# Patient Record
Sex: Female | Born: 1950 | Race: White | Hispanic: No | Marital: Married | Smoking: Former smoker
Health system: Southern US, Community
[De-identification: ages and names within clinical notes are randomized; demographics above are authoritative.]

---

## 2013-08-12 DIAGNOSIS — M545 Low back pain, unspecified: Secondary | ICD-10-CM | POA: Diagnosis not present

## 2013-08-12 DIAGNOSIS — G479 Sleep disorder, unspecified: Secondary | ICD-10-CM | POA: Diagnosis not present

## 2013-08-12 DIAGNOSIS — F411 Generalized anxiety disorder: Secondary | ICD-10-CM | POA: Diagnosis not present

## 2013-08-15 DIAGNOSIS — M545 Low back pain, unspecified: Secondary | ICD-10-CM | POA: Diagnosis not present

## 2013-08-24 DIAGNOSIS — M533 Sacrococcygeal disorders, not elsewhere classified: Secondary | ICD-10-CM | POA: Diagnosis not present

## 2013-08-27 DIAGNOSIS — M533 Sacrococcygeal disorders, not elsewhere classified: Secondary | ICD-10-CM | POA: Diagnosis not present

## 2013-09-01 DIAGNOSIS — M545 Low back pain, unspecified: Secondary | ICD-10-CM | POA: Diagnosis not present

## 2013-09-09 ENCOUNTER — Other Ambulatory Visit: Payer: Self-pay | Admitting: Family Medicine

## 2013-09-09 DIAGNOSIS — F411 Generalized anxiety disorder: Secondary | ICD-10-CM | POA: Diagnosis not present

## 2013-09-09 DIAGNOSIS — Z1239 Encounter for other screening for malignant neoplasm of breast: Secondary | ICD-10-CM | POA: Diagnosis not present

## 2013-09-09 DIAGNOSIS — M810 Age-related osteoporosis without current pathological fracture: Secondary | ICD-10-CM | POA: Diagnosis not present

## 2013-09-09 DIAGNOSIS — Z1211 Encounter for screening for malignant neoplasm of colon: Secondary | ICD-10-CM | POA: Diagnosis not present

## 2013-09-09 DIAGNOSIS — Z23 Encounter for immunization: Secondary | ICD-10-CM | POA: Diagnosis not present

## 2013-09-09 DIAGNOSIS — M948X9 Other specified disorders of cartilage, unspecified sites: Secondary | ICD-10-CM | POA: Diagnosis not present

## 2013-09-09 DIAGNOSIS — G479 Sleep disorder, unspecified: Secondary | ICD-10-CM | POA: Diagnosis not present

## 2013-09-09 DIAGNOSIS — K120 Recurrent oral aphthae: Secondary | ICD-10-CM | POA: Diagnosis not present

## 2013-09-09 DIAGNOSIS — Z0000 Encounter for general adult medical examination without abnormal findings: Secondary | ICD-10-CM | POA: Diagnosis not present

## 2013-09-09 DIAGNOSIS — Z136 Encounter for screening for cardiovascular disorders: Secondary | ICD-10-CM | POA: Diagnosis not present

## 2013-09-09 DIAGNOSIS — Z1231 Encounter for screening mammogram for malignant neoplasm of breast: Secondary | ICD-10-CM

## 2013-09-14 DIAGNOSIS — M545 Low back pain, unspecified: Secondary | ICD-10-CM | POA: Diagnosis not present

## 2013-09-15 DIAGNOSIS — M899 Disorder of bone, unspecified: Secondary | ICD-10-CM | POA: Diagnosis not present

## 2013-09-15 DIAGNOSIS — Z136 Encounter for screening for cardiovascular disorders: Secondary | ICD-10-CM | POA: Diagnosis not present

## 2013-09-15 DIAGNOSIS — M949 Disorder of cartilage, unspecified: Secondary | ICD-10-CM | POA: Diagnosis not present

## 2013-09-15 DIAGNOSIS — Z0000 Encounter for general adult medical examination without abnormal findings: Secondary | ICD-10-CM | POA: Diagnosis not present

## 2013-09-15 DIAGNOSIS — M948X9 Other specified disorders of cartilage, unspecified sites: Secondary | ICD-10-CM | POA: Diagnosis not present

## 2013-09-23 DIAGNOSIS — M545 Low back pain, unspecified: Secondary | ICD-10-CM | POA: Diagnosis not present

## 2013-09-28 DIAGNOSIS — M533 Sacrococcygeal disorders, not elsewhere classified: Secondary | ICD-10-CM | POA: Diagnosis not present

## 2013-09-30 DIAGNOSIS — M545 Low back pain, unspecified: Secondary | ICD-10-CM | POA: Diagnosis not present

## 2013-10-07 DIAGNOSIS — M545 Low back pain, unspecified: Secondary | ICD-10-CM | POA: Diagnosis not present

## 2013-10-18 DIAGNOSIS — M47817 Spondylosis without myelopathy or radiculopathy, lumbosacral region: Secondary | ICD-10-CM | POA: Diagnosis not present

## 2013-10-21 DIAGNOSIS — L570 Actinic keratosis: Secondary | ICD-10-CM | POA: Diagnosis not present

## 2013-10-21 DIAGNOSIS — Z808 Family history of malignant neoplasm of other organs or systems: Secondary | ICD-10-CM | POA: Diagnosis not present

## 2013-10-21 DIAGNOSIS — D225 Melanocytic nevi of trunk: Secondary | ICD-10-CM | POA: Diagnosis not present

## 2013-10-21 DIAGNOSIS — D1801 Hemangioma of skin and subcutaneous tissue: Secondary | ICD-10-CM | POA: Diagnosis not present

## 2013-10-21 DIAGNOSIS — L821 Other seborrheic keratosis: Secondary | ICD-10-CM | POA: Diagnosis not present

## 2013-10-21 DIAGNOSIS — D239 Other benign neoplasm of skin, unspecified: Secondary | ICD-10-CM | POA: Diagnosis not present

## 2013-10-21 DIAGNOSIS — L814 Other melanin hyperpigmentation: Secondary | ICD-10-CM | POA: Diagnosis not present

## 2013-10-21 DIAGNOSIS — L718 Other rosacea: Secondary | ICD-10-CM | POA: Diagnosis not present

## 2013-11-02 ENCOUNTER — Ambulatory Visit
Admission: RE | Admit: 2013-11-02 | Discharge: 2013-11-02 | Disposition: A | Discharge status: Home / Self Care | Payer: Medicare Other | Source: Ambulatory Visit | Attending: Family Medicine | Admitting: Family Medicine

## 2013-11-02 DIAGNOSIS — Z1231 Encounter for screening mammogram for malignant neoplasm of breast: Secondary | ICD-10-CM | POA: Diagnosis not present

## 2013-11-02 DIAGNOSIS — L718 Other rosacea: Secondary | ICD-10-CM | POA: Diagnosis not present

## 2013-11-02 DIAGNOSIS — L814 Other melanin hyperpigmentation: Secondary | ICD-10-CM | POA: Diagnosis not present

## 2013-11-02 DIAGNOSIS — L570 Actinic keratosis: Secondary | ICD-10-CM | POA: Diagnosis not present

## 2013-11-09 DIAGNOSIS — M47816 Spondylosis without myelopathy or radiculopathy, lumbar region: Secondary | ICD-10-CM | POA: Diagnosis not present

## 2013-11-12 DIAGNOSIS — G4700 Insomnia, unspecified: Secondary | ICD-10-CM | POA: Diagnosis not present

## 2013-11-12 DIAGNOSIS — R197 Diarrhea, unspecified: Secondary | ICD-10-CM | POA: Diagnosis not present

## 2013-11-12 DIAGNOSIS — F411 Generalized anxiety disorder: Secondary | ICD-10-CM | POA: Diagnosis not present

## 2013-11-29 DIAGNOSIS — M545 Low back pain: Secondary | ICD-10-CM | POA: Diagnosis not present

## 2013-12-01 ENCOUNTER — Other Ambulatory Visit: Payer: Self-pay | Admitting: Physical Medicine and Rehabilitation

## 2013-12-01 DIAGNOSIS — M533 Sacrococcygeal disorders, not elsewhere classified: Principal | ICD-10-CM

## 2013-12-01 DIAGNOSIS — G8929 Other chronic pain: Secondary | ICD-10-CM

## 2013-12-08 ENCOUNTER — Ambulatory Visit
Admission: RE | Admit: 2013-12-08 | Discharge: 2013-12-08 | Disposition: A | Discharge status: Home / Self Care | Payer: Medicare Other | Source: Ambulatory Visit | Attending: Physical Medicine and Rehabilitation | Admitting: Physical Medicine and Rehabilitation

## 2013-12-08 DIAGNOSIS — M533 Sacrococcygeal disorders, not elsewhere classified: Secondary | ICD-10-CM | POA: Diagnosis not present

## 2013-12-08 DIAGNOSIS — G8929 Other chronic pain: Secondary | ICD-10-CM

## 2013-12-08 MED ORDER — METHYLPREDNISOLONE ACETATE 40 MG/ML INJ SUSP (RADIOLOG: 120.0000 mg | Freq: Once | INTRAMUSCULAR | Status: DC

## 2013-12-09 DIAGNOSIS — F411 Generalized anxiety disorder: Secondary | ICD-10-CM | POA: Diagnosis not present

## 2013-12-09 DIAGNOSIS — R197 Diarrhea, unspecified: Secondary | ICD-10-CM | POA: Diagnosis not present

## 2013-12-09 DIAGNOSIS — G4700 Insomnia, unspecified: Secondary | ICD-10-CM | POA: Diagnosis not present

## 2014-02-14 DIAGNOSIS — K648 Other hemorrhoids: Secondary | ICD-10-CM | POA: Diagnosis not present

## 2014-02-14 DIAGNOSIS — K644 Residual hemorrhoidal skin tags: Secondary | ICD-10-CM | POA: Diagnosis not present

## 2014-02-14 DIAGNOSIS — Z1211 Encounter for screening for malignant neoplasm of colon: Secondary | ICD-10-CM | POA: Diagnosis not present

## 2014-04-21 DIAGNOSIS — G4700 Insomnia, unspecified: Secondary | ICD-10-CM | POA: Diagnosis not present

## 2014-04-21 DIAGNOSIS — F418 Other specified anxiety disorders: Secondary | ICD-10-CM | POA: Diagnosis not present

## 2014-04-22 DIAGNOSIS — M545 Low back pain: Secondary | ICD-10-CM | POA: Diagnosis not present

## 2014-04-29 DIAGNOSIS — M545 Low back pain: Secondary | ICD-10-CM | POA: Diagnosis not present

## 2014-05-04 DIAGNOSIS — M5136 Other intervertebral disc degeneration, lumbar region: Secondary | ICD-10-CM | POA: Diagnosis not present

## 2014-05-19 DIAGNOSIS — L659 Nonscarring hair loss, unspecified: Secondary | ICD-10-CM | POA: Diagnosis not present

## 2014-05-19 DIAGNOSIS — G4700 Insomnia, unspecified: Secondary | ICD-10-CM | POA: Diagnosis not present

## 2014-05-26 DIAGNOSIS — M5136 Other intervertebral disc degeneration, lumbar region: Secondary | ICD-10-CM | POA: Diagnosis not present

## 2014-06-06 DIAGNOSIS — M5136 Other intervertebral disc degeneration, lumbar region: Secondary | ICD-10-CM | POA: Diagnosis not present

## 2014-07-07 DIAGNOSIS — L650 Telogen effluvium: Secondary | ICD-10-CM | POA: Diagnosis not present

## 2014-07-18 DIAGNOSIS — G8929 Other chronic pain: Secondary | ICD-10-CM | POA: Diagnosis not present

## 2014-07-18 DIAGNOSIS — M545 Low back pain: Secondary | ICD-10-CM | POA: Diagnosis not present

## 2014-07-18 DIAGNOSIS — R030 Elevated blood-pressure reading, without diagnosis of hypertension: Secondary | ICD-10-CM | POA: Diagnosis not present

## 2014-07-18 DIAGNOSIS — M4696 Unspecified inflammatory spondylopathy, lumbar region: Secondary | ICD-10-CM | POA: Diagnosis not present

## 2014-08-29 DIAGNOSIS — M47816 Spondylosis without myelopathy or radiculopathy, lumbar region: Secondary | ICD-10-CM | POA: Diagnosis not present

## 2014-08-29 DIAGNOSIS — M545 Low back pain: Secondary | ICD-10-CM | POA: Diagnosis not present

## 2014-08-29 DIAGNOSIS — M4696 Unspecified inflammatory spondylopathy, lumbar region: Secondary | ICD-10-CM | POA: Diagnosis not present

## 2014-10-03 DIAGNOSIS — M47816 Spondylosis without myelopathy or radiculopathy, lumbar region: Secondary | ICD-10-CM | POA: Diagnosis not present

## 2014-10-03 DIAGNOSIS — M545 Low back pain: Secondary | ICD-10-CM | POA: Diagnosis not present

## 2014-10-17 DIAGNOSIS — M4696 Unspecified inflammatory spondylopathy, lumbar region: Secondary | ICD-10-CM | POA: Diagnosis not present

## 2014-10-17 DIAGNOSIS — G8929 Other chronic pain: Secondary | ICD-10-CM | POA: Diagnosis not present

## 2014-10-17 DIAGNOSIS — M545 Low back pain: Secondary | ICD-10-CM | POA: Diagnosis not present

## 2014-10-17 DIAGNOSIS — M47816 Spondylosis without myelopathy or radiculopathy, lumbar region: Secondary | ICD-10-CM | POA: Diagnosis not present

## 2014-10-25 DIAGNOSIS — L570 Actinic keratosis: Secondary | ICD-10-CM | POA: Diagnosis not present

## 2014-10-25 DIAGNOSIS — L814 Other melanin hyperpigmentation: Secondary | ICD-10-CM | POA: Diagnosis not present

## 2014-10-25 DIAGNOSIS — D1800 Hemangioma unspecified site: Secondary | ICD-10-CM | POA: Diagnosis not present

## 2014-10-25 DIAGNOSIS — D225 Melanocytic nevi of trunk: Secondary | ICD-10-CM | POA: Diagnosis not present

## 2014-10-25 DIAGNOSIS — L821 Other seborrheic keratosis: Secondary | ICD-10-CM | POA: Diagnosis not present

## 2014-11-06 DIAGNOSIS — W19XXXA Unspecified fall, initial encounter: Secondary | ICD-10-CM | POA: Diagnosis not present

## 2014-11-06 DIAGNOSIS — R0789 Other chest pain: Secondary | ICD-10-CM | POA: Diagnosis not present

## 2014-11-06 DIAGNOSIS — W1800XA Striking against unspecified object with subsequent fall, initial encounter: Secondary | ICD-10-CM | POA: Diagnosis not present

## 2014-11-07 DIAGNOSIS — M47816 Spondylosis without myelopathy or radiculopathy, lumbar region: Secondary | ICD-10-CM | POA: Diagnosis not present

## 2014-11-07 DIAGNOSIS — M545 Low back pain: Secondary | ICD-10-CM | POA: Diagnosis not present

## 2015-01-09 DIAGNOSIS — M47816 Spondylosis without myelopathy or radiculopathy, lumbar region: Secondary | ICD-10-CM | POA: Diagnosis not present

## 2015-01-09 DIAGNOSIS — M4696 Unspecified inflammatory spondylopathy, lumbar region: Secondary | ICD-10-CM | POA: Diagnosis not present

## 2015-01-09 DIAGNOSIS — M545 Low back pain: Secondary | ICD-10-CM | POA: Diagnosis not present

## 2015-01-09 DIAGNOSIS — G8929 Other chronic pain: Secondary | ICD-10-CM | POA: Diagnosis not present

## 2015-01-30 DIAGNOSIS — M545 Low back pain: Secondary | ICD-10-CM | POA: Diagnosis not present

## 2015-01-30 DIAGNOSIS — M6281 Muscle weakness (generalized): Secondary | ICD-10-CM | POA: Diagnosis not present

## 2015-02-02 DIAGNOSIS — M6281 Muscle weakness (generalized): Secondary | ICD-10-CM | POA: Diagnosis not present

## 2015-02-02 DIAGNOSIS — M545 Low back pain: Secondary | ICD-10-CM | POA: Diagnosis not present

## 2015-02-06 DIAGNOSIS — M545 Low back pain: Secondary | ICD-10-CM | POA: Diagnosis not present

## 2015-02-06 DIAGNOSIS — M6281 Muscle weakness (generalized): Secondary | ICD-10-CM | POA: Diagnosis not present

## 2015-02-09 DIAGNOSIS — M545 Low back pain: Secondary | ICD-10-CM | POA: Diagnosis not present

## 2015-02-09 DIAGNOSIS — M6281 Muscle weakness (generalized): Secondary | ICD-10-CM | POA: Diagnosis not present

## 2015-02-12 DIAGNOSIS — S90122A Contusion of left lesser toe(s) without damage to nail, initial encounter: Secondary | ICD-10-CM | POA: Diagnosis not present

## 2015-02-13 DIAGNOSIS — M6281 Muscle weakness (generalized): Secondary | ICD-10-CM | POA: Diagnosis not present

## 2015-02-13 DIAGNOSIS — M545 Low back pain: Secondary | ICD-10-CM | POA: Diagnosis not present

## 2015-02-16 DIAGNOSIS — M6281 Muscle weakness (generalized): Secondary | ICD-10-CM | POA: Diagnosis not present

## 2015-02-16 DIAGNOSIS — M545 Low back pain: Secondary | ICD-10-CM | POA: Diagnosis not present

## 2015-02-20 DIAGNOSIS — M545 Low back pain: Secondary | ICD-10-CM | POA: Diagnosis not present

## 2015-02-20 DIAGNOSIS — M6281 Muscle weakness (generalized): Secondary | ICD-10-CM | POA: Diagnosis not present

## 2015-02-23 DIAGNOSIS — M545 Low back pain: Secondary | ICD-10-CM | POA: Diagnosis not present

## 2015-02-23 DIAGNOSIS — M6281 Muscle weakness (generalized): Secondary | ICD-10-CM | POA: Diagnosis not present

## 2015-02-27 DIAGNOSIS — M6281 Muscle weakness (generalized): Secondary | ICD-10-CM | POA: Diagnosis not present

## 2015-02-27 DIAGNOSIS — M545 Low back pain: Secondary | ICD-10-CM | POA: Diagnosis not present

## 2015-03-02 DIAGNOSIS — M545 Low back pain: Secondary | ICD-10-CM | POA: Diagnosis not present

## 2015-03-02 DIAGNOSIS — M6281 Muscle weakness (generalized): Secondary | ICD-10-CM | POA: Diagnosis not present

## 2015-03-06 DIAGNOSIS — M545 Low back pain: Secondary | ICD-10-CM | POA: Diagnosis not present

## 2015-03-06 DIAGNOSIS — M6281 Muscle weakness (generalized): Secondary | ICD-10-CM | POA: Diagnosis not present

## 2015-03-09 DIAGNOSIS — M545 Low back pain: Secondary | ICD-10-CM | POA: Diagnosis not present

## 2015-03-09 DIAGNOSIS — M6281 Muscle weakness (generalized): Secondary | ICD-10-CM | POA: Diagnosis not present

## 2015-03-13 DIAGNOSIS — M6281 Muscle weakness (generalized): Secondary | ICD-10-CM | POA: Diagnosis not present

## 2015-03-13 DIAGNOSIS — M545 Low back pain: Secondary | ICD-10-CM | POA: Diagnosis not present

## 2015-03-16 DIAGNOSIS — M545 Low back pain: Secondary | ICD-10-CM | POA: Diagnosis not present

## 2015-03-16 DIAGNOSIS — M6281 Muscle weakness (generalized): Secondary | ICD-10-CM | POA: Diagnosis not present

## 2015-03-20 DIAGNOSIS — M6281 Muscle weakness (generalized): Secondary | ICD-10-CM | POA: Diagnosis not present

## 2015-03-20 DIAGNOSIS — M545 Low back pain: Secondary | ICD-10-CM | POA: Diagnosis not present

## 2015-03-23 DIAGNOSIS — M545 Low back pain: Secondary | ICD-10-CM | POA: Diagnosis not present

## 2015-03-23 DIAGNOSIS — M6281 Muscle weakness (generalized): Secondary | ICD-10-CM | POA: Diagnosis not present

## 2015-04-03 DIAGNOSIS — M545 Low back pain: Secondary | ICD-10-CM | POA: Diagnosis not present

## 2015-04-03 DIAGNOSIS — M6281 Muscle weakness (generalized): Secondary | ICD-10-CM | POA: Diagnosis not present

## 2015-04-06 DIAGNOSIS — M6281 Muscle weakness (generalized): Secondary | ICD-10-CM | POA: Diagnosis not present

## 2015-04-06 DIAGNOSIS — M545 Low back pain: Secondary | ICD-10-CM | POA: Diagnosis not present

## 2015-04-10 DIAGNOSIS — M545 Low back pain: Secondary | ICD-10-CM | POA: Diagnosis not present

## 2015-04-10 DIAGNOSIS — M6281 Muscle weakness (generalized): Secondary | ICD-10-CM | POA: Diagnosis not present

## 2015-04-13 DIAGNOSIS — M545 Low back pain: Secondary | ICD-10-CM | POA: Diagnosis not present

## 2015-04-13 DIAGNOSIS — M6281 Muscle weakness (generalized): Secondary | ICD-10-CM | POA: Diagnosis not present

## 2015-04-17 DIAGNOSIS — M6281 Muscle weakness (generalized): Secondary | ICD-10-CM | POA: Diagnosis not present

## 2015-04-17 DIAGNOSIS — M545 Low back pain: Secondary | ICD-10-CM | POA: Diagnosis not present

## 2015-04-20 DIAGNOSIS — M6281 Muscle weakness (generalized): Secondary | ICD-10-CM | POA: Diagnosis not present

## 2015-04-20 DIAGNOSIS — M545 Low back pain: Secondary | ICD-10-CM | POA: Diagnosis not present

## 2015-04-24 DIAGNOSIS — M6281 Muscle weakness (generalized): Secondary | ICD-10-CM | POA: Diagnosis not present

## 2015-04-24 DIAGNOSIS — M545 Low back pain: Secondary | ICD-10-CM | POA: Diagnosis not present

## 2015-04-27 DIAGNOSIS — M545 Low back pain: Secondary | ICD-10-CM | POA: Diagnosis not present

## 2015-04-27 DIAGNOSIS — M6281 Muscle weakness (generalized): Secondary | ICD-10-CM | POA: Diagnosis not present

## 2015-05-08 DIAGNOSIS — M4696 Unspecified inflammatory spondylopathy, lumbar region: Secondary | ICD-10-CM | POA: Diagnosis not present

## 2015-05-08 DIAGNOSIS — M545 Low back pain: Secondary | ICD-10-CM | POA: Diagnosis not present

## 2015-05-08 DIAGNOSIS — M47816 Spondylosis without myelopathy or radiculopathy, lumbar region: Secondary | ICD-10-CM | POA: Diagnosis not present

## 2015-11-25 DIAGNOSIS — S93401A Sprain of unspecified ligament of right ankle, initial encounter: Secondary | ICD-10-CM | POA: Diagnosis not present

## 2015-11-30 DIAGNOSIS — D225 Melanocytic nevi of trunk: Secondary | ICD-10-CM | POA: Diagnosis not present

## 2015-11-30 DIAGNOSIS — D1800 Hemangioma unspecified site: Secondary | ICD-10-CM | POA: Diagnosis not present

## 2015-11-30 DIAGNOSIS — Z23 Encounter for immunization: Secondary | ICD-10-CM | POA: Diagnosis not present

## 2015-11-30 DIAGNOSIS — L821 Other seborrheic keratosis: Secondary | ICD-10-CM | POA: Diagnosis not present

## 2015-11-30 DIAGNOSIS — B009 Herpesviral infection, unspecified: Secondary | ICD-10-CM | POA: Diagnosis not present

## 2015-11-30 DIAGNOSIS — L814 Other melanin hyperpigmentation: Secondary | ICD-10-CM | POA: Diagnosis not present

## 2015-11-30 DIAGNOSIS — L570 Actinic keratosis: Secondary | ICD-10-CM | POA: Diagnosis not present

## 2016-01-17 ENCOUNTER — Other Ambulatory Visit: Payer: Self-pay | Admitting: Physician Assistant

## 2016-01-17 DIAGNOSIS — Z1231 Encounter for screening mammogram for malignant neoplasm of breast: Secondary | ICD-10-CM

## 2016-01-19 ENCOUNTER — Ambulatory Visit
Admission: RE | Admit: 2016-01-19 | Discharge: 2016-01-19 | Disposition: A | Discharge status: Home / Self Care | Payer: Medicare Other | Source: Ambulatory Visit | Attending: Physician Assistant | Admitting: Physician Assistant

## 2016-01-19 DIAGNOSIS — Z1231 Encounter for screening mammogram for malignant neoplasm of breast: Secondary | ICD-10-CM | POA: Diagnosis not present

## 2016-02-22 DIAGNOSIS — E785 Hyperlipidemia, unspecified: Secondary | ICD-10-CM | POA: Diagnosis not present

## 2016-02-22 DIAGNOSIS — G4700 Insomnia, unspecified: Secondary | ICD-10-CM | POA: Diagnosis not present

## 2016-02-22 DIAGNOSIS — R002 Palpitations: Secondary | ICD-10-CM | POA: Diagnosis not present

## 2016-02-22 DIAGNOSIS — E559 Vitamin D deficiency, unspecified: Secondary | ICD-10-CM | POA: Diagnosis not present

## 2016-02-22 DIAGNOSIS — Z13820 Encounter for screening for osteoporosis: Secondary | ICD-10-CM | POA: Diagnosis not present

## 2016-02-22 DIAGNOSIS — Z0000 Encounter for general adult medical examination without abnormal findings: Secondary | ICD-10-CM | POA: Diagnosis not present

## 2016-02-22 DIAGNOSIS — Z7189 Other specified counseling: Secondary | ICD-10-CM | POA: Diagnosis not present

## 2016-02-23 DIAGNOSIS — H5213 Myopia, bilateral: Secondary | ICD-10-CM | POA: Diagnosis not present

## 2016-02-23 DIAGNOSIS — H353131 Nonexudative age-related macular degeneration, bilateral, early dry stage: Secondary | ICD-10-CM | POA: Diagnosis not present

## 2016-02-23 DIAGNOSIS — H25813 Combined forms of age-related cataract, bilateral: Secondary | ICD-10-CM | POA: Diagnosis not present

## 2016-02-27 DIAGNOSIS — E785 Hyperlipidemia, unspecified: Secondary | ICD-10-CM | POA: Diagnosis not present

## 2016-02-27 DIAGNOSIS — R002 Palpitations: Secondary | ICD-10-CM | POA: Diagnosis not present

## 2016-03-14 DIAGNOSIS — H25811 Combined forms of age-related cataract, right eye: Secondary | ICD-10-CM | POA: Diagnosis not present

## 2016-03-14 DIAGNOSIS — H2511 Age-related nuclear cataract, right eye: Secondary | ICD-10-CM | POA: Diagnosis not present

## 2016-03-28 DIAGNOSIS — H2512 Age-related nuclear cataract, left eye: Secondary | ICD-10-CM | POA: Diagnosis not present

## 2016-03-28 DIAGNOSIS — H25812 Combined forms of age-related cataract, left eye: Secondary | ICD-10-CM | POA: Diagnosis not present

## 2016-04-16 DIAGNOSIS — E2839 Other primary ovarian failure: Secondary | ICD-10-CM | POA: Diagnosis not present

## 2016-04-16 DIAGNOSIS — M810 Age-related osteoporosis without current pathological fracture: Secondary | ICD-10-CM | POA: Diagnosis not present

## 2016-07-04 DIAGNOSIS — M65331 Trigger finger, right middle finger: Secondary | ICD-10-CM | POA: Diagnosis not present

## 2016-07-04 DIAGNOSIS — M79672 Pain in left foot: Secondary | ICD-10-CM | POA: Diagnosis not present

## 2016-07-11 ENCOUNTER — Ambulatory Visit: Payer: Medicare Other

## 2016-07-11 ENCOUNTER — Ambulatory Visit (INDEPENDENT_AMBULATORY_CARE_PROVIDER_SITE_OTHER): Payer: Medicare Other | Admitting: Podiatry

## 2016-07-11 ENCOUNTER — Ambulatory Visit (INDEPENDENT_AMBULATORY_CARE_PROVIDER_SITE_OTHER): Payer: Medicare Other

## 2016-07-11 DIAGNOSIS — M2042 Other hammer toe(s) (acquired), left foot: Secondary | ICD-10-CM | POA: Diagnosis not present

## 2016-07-11 DIAGNOSIS — M65331 Trigger finger, right middle finger: Secondary | ICD-10-CM | POA: Diagnosis not present

## 2016-07-11 DIAGNOSIS — M792 Neuralgia and neuritis, unspecified: Secondary | ICD-10-CM | POA: Diagnosis not present

## 2016-07-11 NOTE — Progress Notes (Signed)
She presents today with a burning pain to the fifth toe of the left foot she states the one on now for about 3 weeks. States that she had similar problems several years ago when she lived in [place] and received an ankle block which cured it until just the other day. She also took Lyrica in the past and it seemed to help as well. She is concerned she may have neuropathy. She does relate back pain but she states that is right-sided.  Objective: Vital signs are stable she's alert and oriented 3. Pulses are palpable. She has pain on palpation of the intermediate dorsal cutaneous nerve as well as the sural nerve. She relates pain on palpation of these nerves which does demonstrate similar pain that she is having daily.  Objective: Vital signs temp is alert orient 3 neuritis sural nerve neuritis of the intermediate dorsal continuous nerve.  Plan: Cannot rule out neuropathy with this however we did go ahead and inject dexamethasone local anesthetic to the 2 points of tenderness today alleviating her symptoms as she was leaving. I will follow-up with her in 1 month and consider a nerve conduction velocity exam.

## 2016-07-19 ENCOUNTER — Ambulatory Visit (INDEPENDENT_AMBULATORY_CARE_PROVIDER_SITE_OTHER): Payer: Medicare Other | Admitting: Podiatry

## 2016-07-19 ENCOUNTER — Encounter: Payer: Self-pay | Admitting: Podiatry

## 2016-07-19 DIAGNOSIS — M779 Enthesopathy, unspecified: Secondary | ICD-10-CM | POA: Diagnosis not present

## 2016-07-19 DIAGNOSIS — M792 Neuralgia and neuritis, unspecified: Secondary | ICD-10-CM

## 2016-07-19 MED ORDER — TRIAMCINOLONE ACETONIDE 10 MG/ML IJ SUSP
10.0000 mg | Freq: Once | INTRAMUSCULAR | Status: AC
  Administered 2016-07-19: 10 mg

## 2016-07-23 ENCOUNTER — Ambulatory Visit (INDEPENDENT_AMBULATORY_CARE_PROVIDER_SITE_OTHER): Payer: Medicare Other | Admitting: Podiatry

## 2016-07-23 ENCOUNTER — Encounter: Payer: Self-pay | Admitting: Podiatry

## 2016-07-23 DIAGNOSIS — M792 Neuralgia and neuritis, unspecified: Secondary | ICD-10-CM

## 2016-07-23 NOTE — Progress Notes (Signed)
She presents today states that she is doing just as badly as she was last Friday when she saw Dr. Glenice Bow who provided her with an injection to her anterior left ankle.  Objective: Vital signs are stable she's alert and oriented 3 start has severe pain left foot. This appears to be some type of neuropathy or entrapment of the left nerve and that she has radiating pain distally from the mid calf along the peroneal tendons. It appears to be superficial. L nerve that is entrapped.  Assessment: Cannot rule out her neuropathy cannot rule out a peroneal nerve entrapment.  Plan: Requesting a nerve conduction velocity exam.

## 2016-07-26 ENCOUNTER — Telehealth: Payer: Self-pay | Admitting: *Deleted

## 2016-07-26 DIAGNOSIS — G589 Mononeuropathy, unspecified: Secondary | ICD-10-CM

## 2016-07-26 NOTE — Telephone Encounter (Addendum)
-----   Message from Rip Harbour, [facility] sent at 07/23/2016  5:03 PM EDT ----- Regarding: Neuro referral NCVE - superficial peroneal nerve entrapment left   07/26/16 Orders sent for nerve conduction study to [facility]

## 2016-08-05 ENCOUNTER — Telehealth: Payer: Self-pay | Admitting: *Deleted

## 2016-08-05 ENCOUNTER — Other Ambulatory Visit: Payer: Self-pay | Admitting: *Deleted

## 2016-08-05 ENCOUNTER — Encounter: Payer: Self-pay | Admitting: Neurology

## 2016-08-05 DIAGNOSIS — G5782 Other specified mononeuropathies of left lower limb: Secondary | ICD-10-CM

## 2016-08-05 NOTE — Telephone Encounter (Signed)
Pt states she was to have a nerve conduction test, but no one has called her on her cell phone, and she is traveling. I told pt I would fax to [facility] and she could call [phone] to schedule. Pt states understanding. Faxed orders to [facility].

## 2016-08-08 ENCOUNTER — Encounter: Payer: Self-pay | Admitting: Podiatry

## 2016-08-08 ENCOUNTER — Ambulatory Visit (INDEPENDENT_AMBULATORY_CARE_PROVIDER_SITE_OTHER): Payer: Medicare Other | Admitting: Podiatry

## 2016-08-08 DIAGNOSIS — M65331 Trigger finger, right middle finger: Secondary | ICD-10-CM | POA: Diagnosis not present

## 2016-08-08 DIAGNOSIS — M792 Neuralgia and neuritis, unspecified: Secondary | ICD-10-CM | POA: Diagnosis not present

## 2016-08-08 NOTE — Progress Notes (Signed)
She presents today for follow-up of her pain to her fifth toe. She states that she just was let us know that she is feeling pain when she touches her thigh she can feel the pain run all way down to her fifth toe. She has yet to have her nerve conduction velocity exam performed.  Objective: Pulses are palpable she still has pain on palpation of the sural nerve distribution.  Assessment: Sural nerve/fifth digital nerve neuritis. This is more than likely associated with back.  Plan: Follow up with me once her nerve conduction test is complete.

## 2016-08-27 ENCOUNTER — Ambulatory Visit (INDEPENDENT_AMBULATORY_CARE_PROVIDER_SITE_OTHER): Payer: Medicare Other | Admitting: Neurology

## 2016-08-27 DIAGNOSIS — G5782 Other specified mononeuropathies of left lower limb: Secondary | ICD-10-CM

## 2016-08-27 NOTE — Procedures (Signed)
[facility]  [place], [place]  [place], [place] [address] Tel: [phone] Fax:  [phone] Test Date:  08/27/2016  Patient: Beverly Robinson DOB: 05/04/1950 Physician: Narda Amber, DO  Sex: Female   Ref Phys: Tyson Dense, DPM  ID#: [id number] Temp: 34.3C Technician:    Patient Complaints: This is a 66 year-old female referred for evaluation of left foot numbness, especially over the 5th digit.  NCV & EMG Findings: Electrodiagnostic testing was limited to nerve conduction study of the left lower extremity, as patient did not wish to proceed with needle electrode examination. Findings are as follows.  1. Left sural and superficial peroneal nerves are within normal limits. 2. Left peroneal motor response at the extensor digitorum shows reduced amplitude, and is normal at the tibialis anterior. The left tibial motor response also shows reduced amplitude. 3. Left tibial H reflex study is within normal limits. 4. Needle electrode examination was not performed at patient's request.  Impression: This is an incomplete study and was prematurely terminated at patient's request.   There is no evidence of a sensory neuropathy or sural mononeuropathy.  Reduced amplitude of the peroneal (EDB) and tibial motor responses are of unclear clinical significance in the absence of needle electrode examination.   ___________________________ Narda Amber, DO    Nerve Conduction Studies Anti Sensory Summary Table   Site NR Peak (ms) Norm Peak (ms) P-T Amp (V) Norm P-T Amp  Left Sup Peroneal Anti Sensory (Ant Lat Mall)  12 cm    2.8 <4.6 6.6 >3  Left Sural Anti Sensory (Lat Mall)  Calf    4.0 <4.6 3.4 >3   Motor Summary Table   Site NR Onset (ms) Norm Onset (ms) O-P Amp (mV) Norm O-P Amp Site1 Site2 Delta-0 (ms) Dist (cm) Vel (m/s) Norm Vel (m/s)  Left Peroneal Motor (Ext Dig Brev)  Ankle    3.8 <6.0 2.3 >2.5 B Fib Ankle 7.1 34.0 48 >40  B Fib    10.9  2.3  Poplt B Fib 1.8  8.0 44 >40  Poplt    12.7  2.1         Left Peroneal TA Motor (Tib Ant)  Fib Head    2.7 <4.5 4.6 >3 Poplit Fib Head 1.5 8.0 53 >40  Poplit    4.2  4.6         Left Tibial Motor (Abd Hall Brev)  Ankle    3.8 <6.0 3.4 >4 Knee Ankle 8.9 40.0 45 >40  Knee    12.7  2.0          H Reflex Studies   NR H-Lat (ms) Lat Norm (ms) L-R H-Lat (ms)  Left Tibial (Gastroc)     31.97 <35       Waveforms:

## 2016-08-28 ENCOUNTER — Telehealth: Payer: Self-pay | Admitting: Podiatry

## 2016-08-28 NOTE — Telephone Encounter (Signed)
-----   Message from Alda Berthold, DO sent at 08/28/2016  7:59 AM EDT ----- Hi Dr. Milinda Pointer, this patient came in for EMG but she was very anxious and only completed NCS portion of the test.  She did not want to proceed with needle electrode study, so results are limited.  Some of the reduced distal motor amplitudes can be associated with age, but without needle portion, cannot determine if it is due to radiculopathy.  MRI lumbar spine can assess this, if clinically warranted.  Thanks, [employer]

## 2016-09-10 DIAGNOSIS — M654 Radial styloid tenosynovitis [de Quervain]: Secondary | ICD-10-CM | POA: Diagnosis not present

## 2016-09-10 DIAGNOSIS — M65331 Trigger finger, right middle finger: Secondary | ICD-10-CM | POA: Diagnosis not present

## 2016-11-06 NOTE — Progress Notes (Signed)
She presents today with a burning pain to the fifth toe of the left foot she states the one on now for about 3 weeks. She also took Lyrica in the past and it seemed to help as well. She is concerned she may have neuropathy. She does relate back pain but she states that is right-sided.  Objective: Vital signs are stable she's alert and oriented 3. Pulses are palpable. She has pain on palpation of the intermediate dorsal cutaneous nerve as well as the sural nerve. She relates pain on palpation of these nerves which does demonstrate similar pain that she is having daily.  Objective: Vital signs temp is alert orient 3 neuritis sural nerve neuritis of the intermediate dorsal continuous nerve.  Plan: Cannot rule out neuropathy with this however we did go ahead and inject kenalog with local anesthetic to the 2 points of tenderness today alleviating her symptoms as she was leaving. She is to follow up in 2 weeks with Dr Milinda Pointer or sooner if needed.

## 2016-11-25 ENCOUNTER — Ambulatory Visit: Payer: Medicare Other | Admitting: Neurology

## 2016-12-11 DIAGNOSIS — M65331 Trigger finger, right middle finger: Secondary | ICD-10-CM | POA: Diagnosis not present

## 2016-12-23 ENCOUNTER — Other Ambulatory Visit: Payer: Self-pay | Admitting: Physician Assistant

## 2016-12-23 DIAGNOSIS — Z139 Encounter for screening, unspecified: Secondary | ICD-10-CM

## 2017-01-08 DIAGNOSIS — M65331 Trigger finger, right middle finger: Secondary | ICD-10-CM | POA: Diagnosis not present

## 2017-01-22 ENCOUNTER — Ambulatory Visit
Admission: RE | Admit: 2017-01-22 | Discharge: 2017-01-22 | Disposition: A | Discharge status: Home / Self Care | Payer: Medicare Other | Source: Ambulatory Visit | Attending: Physician Assistant | Admitting: Physician Assistant

## 2017-01-22 DIAGNOSIS — Z139 Encounter for screening, unspecified: Secondary | ICD-10-CM

## 2017-01-22 DIAGNOSIS — Z1231 Encounter for screening mammogram for malignant neoplasm of breast: Secondary | ICD-10-CM | POA: Diagnosis not present

## 2017-03-24 DIAGNOSIS — Z0000 Encounter for general adult medical examination without abnormal findings: Secondary | ICD-10-CM | POA: Diagnosis not present

## 2017-03-24 DIAGNOSIS — I7300 Raynaud's syndrome without gangrene: Secondary | ICD-10-CM | POA: Diagnosis not present

## 2017-03-24 DIAGNOSIS — E559 Vitamin D deficiency, unspecified: Secondary | ICD-10-CM | POA: Diagnosis not present

## 2017-03-24 DIAGNOSIS — E785 Hyperlipidemia, unspecified: Secondary | ICD-10-CM | POA: Diagnosis not present

## 2017-03-26 ENCOUNTER — Other Ambulatory Visit: Payer: Self-pay

## 2017-03-26 DIAGNOSIS — I7300 Raynaud's syndrome without gangrene: Secondary | ICD-10-CM

## 2017-04-08 DIAGNOSIS — M720 Palmar fascial fibromatosis [Dupuytren]: Secondary | ICD-10-CM | POA: Diagnosis not present

## 2017-04-08 DIAGNOSIS — M65331 Trigger finger, right middle finger: Secondary | ICD-10-CM | POA: Diagnosis not present

## 2017-05-07 ENCOUNTER — Ambulatory Visit (HOSPITAL_COMMUNITY)
Admission: RE | Admit: 2017-05-07 | Discharge: 2017-05-07 | Disposition: A | Discharge status: Home / Self Care | Payer: Medicare Other | Source: Ambulatory Visit | Attending: Vascular Surgery | Admitting: Vascular Surgery

## 2017-05-07 ENCOUNTER — Encounter: Payer: Self-pay | Admitting: Vascular Surgery

## 2017-05-07 ENCOUNTER — Other Ambulatory Visit: Payer: Self-pay

## 2017-05-07 ENCOUNTER — Ambulatory Visit (INDEPENDENT_AMBULATORY_CARE_PROVIDER_SITE_OTHER): Payer: Medicare Other | Admitting: Vascular Surgery

## 2017-05-07 VITALS — BP 115/73 | HR 83 | Temp 98.9°F | Resp 16 | Ht 64.0 in | Wt 118.5 lb

## 2017-05-07 DIAGNOSIS — I7300 Raynaud's syndrome without gangrene: Secondary | ICD-10-CM | POA: Insufficient documentation

## 2017-05-07 NOTE — Progress Notes (Signed)
Patient name: Beverly Robinson MRN: [id number] DOB: December 01, 1950 Sex: female   REASON FOR CONSULT:    Possible Raynaud's syndrome.  The consult is requested by Genelle Bal, NP  HPI:   Beverly Robinson is a pleasant 67 y.o. female, with a long history of paresthesias in both feet mostly on the plantar aspect of both feet.  This began many years ago when she lived in [place].  She had grown up in [place] and did skiing in the past so is certainly had cold exposure but denies any history of frostbite.  She denies any history of injury to her feet.  She has no history of diabetes.  She has no significant back pain or nerve issues that she is aware of.  She describes paresthesias in both feet which is chronic and is not episodic.  She denies any change in color of her feet such as the sudden onset of bluish, pale or reddish discoloration to suggest Raynaud's syndrome.  She denies any history of claudication, rest pain, or nonhealing ulcers.  She has no real risk factors for peripheral vascular disease.  She denies any history of diabetes, hypertension, hypercholesterolemia, or history of tobacco use.  History reviewed. No pertinent past medical history.  Family History  Problem Relation Age of Onset  . Breast cancer Maternal Aunt 92  She does tell me that she had a brother who had a heart attack at age 72.   SOCIAL HISTORY: She is not a smoker. Social History   Socioeconomic History  . Marital status: Married    Spouse name: Not on file  . Number of children: Not on file  . Years of education: Not on file  . Highest education level: Not on file  Occupational History  . Not on file  Social Needs  . Financial resource strain: Not on file  . Food insecurity:    Worry: Not on file    Inability: Not on file  . Transportation needs:    Medical: Not on file    Non-medical: Not on file  Tobacco Use  . Smoking status: Former Research scientist (life sciences)  . Smokeless tobacco: Never Used  Substance and  Sexual Activity  . Alcohol use: Not on file  . Drug use: Not on file  . Sexual activity: Not on file  Lifestyle  . Physical activity:    Days per week: Not on file    Minutes per session: Not on file  . Stress: Not on file  Relationships  . Social connections:    Talks on phone: Not on file    Gets together: Not on file    Attends religious service: Not on file    Active member of club or organization: Not on file    Attends meetings of clubs or organizations: Not on file    Relationship status: Not on file  . Intimate partner violence:    Fear of current or ex partner: Not on file    Emotionally abused: Not on file    Physically abused: Not on file    Forced sexual activity: Not on file  Other Topics Concern  . Not on file  Social History Narrative  . Not on file    Allergies  Allergen Reactions  . Codeine Nausea Only    Nausea and headaches  . Penicillins Hives and Nausea Only  . Tylenol [Acetaminophen] Nausea Only and Other (See Comments)    Nausea and Headaches    No current outpatient medications  on file.   No current facility-administered medications for this visit.     REVIEW OF SYSTEMS:  [X]  denotes positive finding, [ ]  denotes negative finding Cardiac  Comments:  Chest pain or chest pressure:    Shortness of breath upon exertion:    Short of breath when lying flat:    Irregular heart rhythm:        Vascular    Pain in calf, thigh, or hip brought on by ambulation:    Pain in feet at night that wakes you up from your sleep:     Blood clot in your veins:    Leg swelling:         Pulmonary    Oxygen at home:    Productive cough:     Wheezing:         Neurologic    Sudden weakness in arms or legs:     Sudden numbness in arms or legs:     Sudden onset of difficulty speaking or slurred speech:    Temporary loss of vision in one eye:     Problems with dizziness:         Gastrointestinal    Blood in stool:     Vomited blood:           Genitourinary    Burning when urinating:     Blood in urine:        Psychiatric    Major depression:         Hematologic    Bleeding problems:    Problems with blood clotting too easily:        Skin    Rashes or ulcers:        Constitutional    Fever or chills:     PHYSICAL EXAM:   Vitals:   05/07/17 1503  BP: 115/73  Pulse: 83  Resp: 16  Temp: 98.9 F (37.2 C)  TempSrc: Oral  SpO2: 96%  Weight: 118 lb 8 oz (53.8 kg)  Height: 5\' 4"  (1.626 m)    GENERAL: The patient is a well-nourished female, in no acute distress. The vital signs are documented above. CARDIAC: There is a regular rate and rhythm.  VASCULAR: I do not detect carotid bruits. On the right side she has a palpable femoral, popliteal, and posterior tibial pulse.  I cannot palpate a dorsalis pedis pulse however she has a brisk dorsalis pedis signal with the Doppler. On the left side she has a palpable femoral, popliteal, dorsalis pedis, and posterior tibial pulse. Both feet are slightly cooler she has no cyanosis. PULMONARY: There is good air exchange bilaterally without wheezing or rales. ABDOMEN: Soft and non-tender with normal pitched bowel sounds.  I do not palpate an abdominal aortic aneurysm MUSCULOSKELETAL: There are no major deformities or cyanosis. NEUROLOGIC: No focal weakness or paresthesias are detected. SKIN: There are no ulcers or rashes noted. PSYCHIATRIC: The patient has a normal affect.  DATA:    ARTERIAL DOPPLER STUDY: I have independently interpreted her arterial Doppler study today.  On the right side she has a triphasic dorsalis pedis and posterior tibial signal with an ABI of 100%.  Toe pressure on the right is 87 mmHg.  On the left side she has a triphasic anterior tibial and posterior tibial signal.  ABI on the left is 100%.  Toe pressure on the left is 73 mmHg.  MEDICAL ISSUES:   PARESTHESIAS BOTH FEET: This patient describes paresthesias in both feet which is chronic.  This  does not appear to be episodic so I am not convinced that this represents Raynaud's syndrome.  She has no history of diabetes and no real reason to have neuropathy.  Her main concern is the feet feel cold and are numb on the dorsum of the feet.  I reassured her that she has excellent pulses and no evidence of large vessel disease.  She may have this small vessel disease although has no real risk factors for this.  From my standpoint of simply encouraged her to keep her feet warm especially in the winter.  The only other consideration would be to consider a calcium channel blocker such as amlodipine.  If her symptoms become more bothersome and I would obtain a rheumatology consult to rule out an underlying small vessel disease or autoimmune disease which could potentially explain her symptoms.  I will be happy to see her back at any time if any new vascular issues arise.  Deitra Mayo [facility] [phone]

## 2017-07-08 DIAGNOSIS — M65331 Trigger finger, right middle finger: Secondary | ICD-10-CM | POA: Diagnosis not present

## 2017-09-04 DIAGNOSIS — M65331 Trigger finger, right middle finger: Secondary | ICD-10-CM | POA: Diagnosis not present

## 2017-09-04 DIAGNOSIS — M19041 Primary osteoarthritis, right hand: Secondary | ICD-10-CM | POA: Diagnosis not present

## 2017-10-21 DIAGNOSIS — M79674 Pain in right toe(s): Secondary | ICD-10-CM | POA: Diagnosis not present

## 2017-10-29 DIAGNOSIS — M79672 Pain in left foot: Secondary | ICD-10-CM | POA: Diagnosis not present

## 2017-11-12 DIAGNOSIS — R0781 Pleurodynia: Secondary | ICD-10-CM | POA: Diagnosis not present

## 2017-11-12 DIAGNOSIS — S20212A Contusion of left front wall of thorax, initial encounter: Secondary | ICD-10-CM | POA: Diagnosis not present

## 2018-02-05 ENCOUNTER — Other Ambulatory Visit: Payer: Self-pay | Admitting: Family Medicine

## 2018-02-05 DIAGNOSIS — Z1231 Encounter for screening mammogram for malignant neoplasm of breast: Secondary | ICD-10-CM

## 2018-02-25 ENCOUNTER — Encounter (HOSPITAL_COMMUNITY): Payer: Self-pay | Admitting: Emergency Medicine

## 2018-02-25 ENCOUNTER — Emergency Department (HOSPITAL_COMMUNITY)
Admission: EM | Admit: 2018-02-25 | Discharge: 2018-02-25 | Disposition: A | Discharge status: Home / Self Care | Payer: Medicare Other | Attending: Emergency Medicine | Admitting: Emergency Medicine

## 2018-02-25 ENCOUNTER — Emergency Department (HOSPITAL_COMMUNITY): Payer: Medicare Other

## 2018-02-25 DIAGNOSIS — W010XXA Fall on same level from slipping, tripping and stumbling without subsequent striking against object, initial encounter: Secondary | ICD-10-CM | POA: Insufficient documentation

## 2018-02-25 DIAGNOSIS — Y999 Unspecified external cause status: Secondary | ICD-10-CM | POA: Diagnosis not present

## 2018-02-25 DIAGNOSIS — S62102A Fracture of unspecified carpal bone, left wrist, initial encounter for closed fracture: Secondary | ICD-10-CM | POA: Diagnosis not present

## 2018-02-25 DIAGNOSIS — S52692A Other fracture of lower end of left ulna, initial encounter for closed fracture: Secondary | ICD-10-CM | POA: Diagnosis not present

## 2018-02-25 DIAGNOSIS — Y929 Unspecified place or not applicable: Secondary | ICD-10-CM | POA: Insufficient documentation

## 2018-02-25 DIAGNOSIS — S52592A Other fractures of lower end of left radius, initial encounter for closed fracture: Secondary | ICD-10-CM | POA: Diagnosis not present

## 2018-02-25 DIAGNOSIS — Z87891 Personal history of nicotine dependence: Secondary | ICD-10-CM | POA: Diagnosis not present

## 2018-02-25 DIAGNOSIS — Y939 Activity, unspecified: Secondary | ICD-10-CM | POA: Diagnosis not present

## 2018-02-25 DIAGNOSIS — S6292XA Unspecified fracture of left wrist and hand, initial encounter for closed fracture: Secondary | ICD-10-CM | POA: Diagnosis not present

## 2018-02-25 DIAGNOSIS — S52502A Unspecified fracture of the lower end of left radius, initial encounter for closed fracture: Secondary | ICD-10-CM | POA: Diagnosis not present

## 2018-02-25 DIAGNOSIS — S52602A Unspecified fracture of lower end of left ulna, initial encounter for closed fracture: Secondary | ICD-10-CM | POA: Diagnosis not present

## 2018-02-25 MED ORDER — OXYCODONE HCL 5 MG PO TABS
5.0000 mg | ORAL_TABLET | Freq: Once | ORAL | Status: DC
  Filled 2018-02-25: qty 1

## 2018-02-25 MED ORDER — DIPHENHYDRAMINE HCL 50 MG/ML IJ SOLN
12.5000 mg | Freq: Once | INTRAMUSCULAR | Status: AC
  Administered 2018-02-25: 12.5 mg via INTRAVENOUS
  Filled 2018-02-25: qty 1

## 2018-02-25 MED ORDER — METOCLOPRAMIDE HCL 5 MG/ML IJ SOLN
5.0000 mg | Freq: Once | INTRAMUSCULAR | Status: AC
  Administered 2018-02-25: 5 mg via INTRAVENOUS
  Filled 2018-02-25: qty 2

## 2018-02-25 MED ORDER — KETOROLAC TROMETHAMINE 30 MG/ML IJ SOLN
15.0000 mg | Freq: Once | INTRAMUSCULAR | Status: AC
  Administered 2018-02-25: 15 mg via INTRAVENOUS
  Filled 2018-02-25: qty 1

## 2018-02-25 MED ORDER — ONDANSETRON 4 MG PO TBDP
4.0000 mg | ORAL_TABLET | Freq: Once | ORAL | Status: DC
  Filled 2018-02-25: qty 1

## 2018-02-25 MED ORDER — LIDOCAINE HCL (PF) 1 % IJ SOLN
30.0000 mL | Freq: Once | INTRAMUSCULAR | Status: AC
  Administered 2018-02-25: 30 mL via INTRADERMAL
  Filled 2018-02-25: qty 30

## 2018-02-25 MED ORDER — FENTANYL CITRATE (PF) 100 MCG/2ML IJ SOLN
25.0000 ug | Freq: Once | INTRAMUSCULAR | Status: AC
  Administered 2018-02-25: 25 ug via INTRAVENOUS
  Filled 2018-02-25: qty 2

## 2018-02-25 MED ORDER — FENTANYL CITRATE (PF) 100 MCG/2ML IJ SOLN
50.0000 ug | Freq: Once | INTRAMUSCULAR | Status: AC
  Administered 2018-02-25: 50 ug via INTRAVENOUS
  Filled 2018-02-25: qty 2

## 2018-02-25 NOTE — ED Provider Notes (Signed)
[facility] EMERGENCY DEPARTMENT Provider Note   CSN: [id number] Arrival date & time: 02/25/18  0422     History   Chief Complaint Chief Complaint  Patient presents with  . Fall  . Arm Injury    HPI Beverly Robinson is a 68 y.o. female  Who presents with left wrist injury.  Patient states that she had a mechanical fall in the middle of the night.  She fell asleep on the couch and got up to go to bed and tripped over the rubber soled on her shoes.  She fell onto her left outstretched hand and had immediate severe pain.  Patient called an Melburn Popper to bring her to the hospital.  She is right-hand dominant.  She denies any numbness or tingling.  She complains of 10 out of 10 pain.  No head injury or LOC. The history is provided by the patient. No language interpreter was used.    History reviewed. No pertinent past medical history.  There are no active problems to display for this patient.   History reviewed. No pertinent surgical history.   OB History   No obstetric history on file.      Home Medications    Prior to Admission medications   Not on File    Family History Family History  Problem Relation Age of Onset  . Breast cancer Maternal Aunt 92    Social History Social History   Tobacco Use  . Smoking status: Former Research scientist (life sciences)  . Smokeless tobacco: Never Used  Substance Use Topics  . Alcohol use: Not on file  . Drug use: Not on file     Allergies   Codeine; Penicillins; and Tylenol [acetaminophen]   Review of Systems Review of Systems  Constitutional: Negative.   HENT: Negative.   Eyes: Negative.   Respiratory: Negative.   Cardiovascular: Negative.   Gastrointestinal: Negative.   Endocrine: Negative.   Genitourinary: Negative.   Musculoskeletal: Positive for joint swelling.  Skin: Negative for wound.  Neurological: Negative.  Negative for numbness.  Hematological: Negative.   All other systems reviewed and are  negative.    Physical Exam Updated Vital Signs BP 108/64   Pulse 65   Temp 97.7 F (36.5 C) (Oral)   Resp 15   SpO2 100%   Physical Exam Vitals signs and nursing note reviewed.  Constitutional:      General: She is not in acute distress.    Appearance: She is well-developed. She is not diaphoretic.  HENT:     Head: Normocephalic and atraumatic.  Eyes:     General: No scleral icterus.    Conjunctiva/sclera: Conjunctivae normal.  Neck:     Musculoskeletal: Normal range of motion.  Cardiovascular:     Rate and Rhythm: Normal rate and regular rhythm.     Heart sounds: Normal heart sounds. No murmur. No friction rub. No gallop.   Pulmonary:     Effort: Pulmonary effort is normal. No respiratory distress.     Breath sounds: Normal breath sounds.  Abdominal:     General: Bowel sounds are normal. There is no distension.     Palpations: Abdomen is soft. There is no mass.     Tenderness: There is no abdominal tenderness. There is no guarding.  Musculoskeletal:     Left wrist: She exhibits decreased range of motion, bony tenderness, swelling, crepitus and deformity. She exhibits no effusion and no laceration.     Comments: Left wrist with obvious deformity, swelling, bruising.  Exquisitely tender to palpation pain with any movement, normal radial pulse, sensation normal within the fingers.  Normal ipsilateral elbow and shoulder examination  Skin:    General: Skin is warm and dry.  Neurological:     Mental Status: She is alert and oriented to person, place, and time.  Psychiatric:        Behavior: Behavior normal.      ED Treatments / Results  Labs (all labs ordered are listed, but only abnormal results are displayed) Labs Reviewed - No data to display  EKG None  Radiology Dg Forearm Left  Result Date: 02/25/2018 CLINICAL DATA:  Deformity with pain after fall.  Initial encounter. EXAM: LEFT FOREARM - 2 VIEW COMPARISON:  None. FINDINGS: Limited positioning due to clinical  circumstances, no true AP view is available. There are transverse fractures of the distal radial and ulnar metaphyses with dorsal impaction and dorsal wrist tilting. IMPRESSION: 1. Distal radius and ulna fractures with dorsal impaction and wrist tilting. 2. A true AP view was not acquired. Electronically Signed   By: Monte Fantasia M.D.   On: 02/25/2018 05:06    Procedures .Nerve Block Date/Time: 02/27/2018 10:02 AM Performed by: Margarita Mail, PA-C Authorized by: Margarita Mail, PA-C   Consent:    Consent obtained:  Verbal   Consent given by:  Patient   Risks discussed:  Infection, swelling, bleeding, pain and unsuccessful block Indications:    Indications:  Pain relief Location:    Body area:  Upper extremity   Upper extremity nerve blocked: hematoma block.   Laterality:  Left Pre-procedure details:    Skin preparation:  Povidone-iodine Procedure details (see MAR for exact dosages):    Block needle gauge:  21 G   Anesthetic injected:  Lidocaine 1% w/o epi   Steroid injected:  None   Additive injected:  None   Injection procedure:  Anatomic landmarks identified   Paresthesia:  None Post-procedure details:    Outcome:  Pain unchanged   Patient tolerance of procedure:  Tolerated well, no immediate complications   (including critical care time)  Medications Ordered in ED Medications  fentaNYL (SUBLIMAZE) injection 25 mcg (has no administration in time range)  ketorolac (TORADOL) 30 MG/ML injection 15 mg (has no administration in time range)  lidocaine (PF) (XYLOCAINE) 1 % injection 30 mL (30 mLs Intradermal Given 02/25/18 0639)  metoCLOPramide (REGLAN) injection 5 mg (5 mg Intravenous Given 02/25/18 0709)  diphenhydrAMINE (BENADRYL) injection 12.5 mg (12.5 mg Intravenous Given 02/25/18 1761)    SPLINT APPLICATION Date/Time: 60:73 AM Authorized by: Margarita Mail Consent: Verbal consent obtained. Risks and benefits: risks, benefits and alternatives were discussed Consent  given by: patient Splint applied by: orthopedic technician Location details: left wrist Splint type: sugar tong Supplies used: ortho glass Post-procedure: The splinted body part was neurovascularly unchanged following the procedure. Patient tolerance: Patient tolerated the procedure well with no immediate complications.    Initial Impression / Assessment and Plan / ED Course  I have reviewed the triage vital signs and the nursing notes.  Pertinent labs & imaging results that were available during my care of the patient were reviewed by me and considered in my medical decision making (see chart for details).     Patient with comminuted distal radius and ulna fracture.  Seen in the emergency department by Hilbert Odor, PA-C as well as Dr. Grandville Silos of hand surgery with whom she has an established relationship.  Patient placed in sugar tong splint and sling.  She was  given instructions for follow-up.  Pain improved here in the emergency department.  She appears appropriate for discharge at this time.  Final Clinical Impressions(s) / ED Diagnoses   Final diagnoses:  Closed fracture of left wrist, initial encounter    ED Discharge Orders    None       Margarita Mail, PA-C 02/27/18 1004    Sherwood Gambler, MD 02/28/18 [phone]

## 2018-02-25 NOTE — Progress Notes (Signed)
Orthopedic Tech Progress Note Patient Details:  Beverly Robinson 04-16-1950 [id number] She did an amazing job  Ortho Devices Type of Ortho Device: Shoulder immobilizer, Sugartong splint Ortho Device/Splint Location: left arm Ortho Device/Splint Interventions: Adjustment, Application, Ordered   Post Interventions Patient Tolerated: Well Instructions Provided: Care of device, Adjustment of device   Janit Pagan 02/25/2018, 9:34 AM

## 2018-02-25 NOTE — Discharge Instructions (Addendum)
Contact a health care provider if: Your cast, splint, or sling is damaged or loose. You have any new pain, swelling, or bruising. Your pain, swelling, and bruising do not improve. You have a fever. You have chills. Get help right away if: Your skin or fingers on your injured arm turn blue or gray. Your arm feels cold or numb. You have severe pain in your injured wrist. 

## 2018-02-25 NOTE — Consult Note (Addendum)
Reason for Consult:Left wrist fx Referring Physician: A Samaia Robinson is an 68 y.o. female.  HPI: Beverly Robinson was at home this morning when her slippers caught on the rug and she fell. It happened so fast she's not quite sure of the circumstances of how she fell. She had immediate left wrist pain. She tried icing it but it felt so bad later this morning that she decided to come to the ED for evaluation. X-rays showed Beverly wrist fx and hand surgery was consulted. She is RHD.  History reviewed. No pertinent past medical history.  History reviewed. No pertinent surgical history.  Family History  Problem Relation Age of Onset  . Breast cancer Maternal Aunt 92    Social History:  reports that she has quit smoking. She has never used smokeless tobacco. No history on file for alcohol and drug.  Allergies:  Allergies  Allergen Reactions  . Codeine Nausea Only    Nausea and headaches  . Penicillins Hives and Nausea Only  . Tylenol [Acetaminophen] Nausea Only and Other (See Comments)    Nausea and Headaches  . Oxycodone Nausea Only and Rash    Medications: I have reviewed the patient's current medications.  No results found for this or any previous visit (from the past 48 hour(s)).  Dg Forearm Left  Result Date: 02/25/2018 CLINICAL DATA:  Deformity with pain after fall.  Initial encounter. EXAM: LEFT FOREARM - 2 VIEW COMPARISON:  None. FINDINGS: Limited positioning due to clinical circumstances, no true AP view is available. There are transverse fractures of the distal radial and ulnar metaphyses with dorsal impaction and dorsal wrist tilting. IMPRESSION: 1. Distal radius and ulna fractures with dorsal impaction and wrist tilting. 2. Beverly true AP view was not acquired. Electronically Signed   By: Monte Fantasia M.D.   On: 02/25/2018 05:06    Review of Systems  Constitutional: Negative for weight loss.  HENT: Negative for ear discharge, ear pain, hearing loss and tinnitus.   Eyes:  Negative for blurred vision, double vision, photophobia and pain.  Respiratory: Negative for cough, sputum production and shortness of breath.   Cardiovascular: Negative for chest pain.  Gastrointestinal: Negative for abdominal pain, nausea and vomiting.  Genitourinary: Negative for dysuria, flank pain, frequency and urgency.  Musculoskeletal: Positive for joint pain (Left wrist). Negative for back pain, falls, myalgias and neck pain.  Neurological: Negative for dizziness, tingling, sensory change, focal weakness, loss of consciousness and headaches.  Endo/Heme/Allergies: Does not bruise/bleed easily.  Psychiatric/Behavioral: Negative for depression, memory loss and substance abuse. The patient is not nervous/anxious.   All other systems reviewed and are negative.  Blood pressure 105/63, pulse 73, temperature 97.7 F (36.5 C), temperature source Oral, resp. rate 14, SpO2 94 %. Physical Exam  Constitutional: She appears well-developed and well-nourished. No distress.  HENT:  Head: Normocephalic and atraumatic.  Eyes: Conjunctivae are normal. Right eye exhibits no discharge. Left eye exhibits no discharge. No scleral icterus.  Neck: Normal range of motion.  Cardiovascular: Normal rate and regular rhythm.  Respiratory: Effort normal. No respiratory distress.  Musculoskeletal:     Comments: Left shoulder, elbow, wrist, digits- no skin wounds, severe TTP, swelling of wrist  Sens  Ax/R/M/U intact  Mot   Ax/ R/ PIN/ M/U intact, AIN weak (? Pain)  Rad 2+  Neurological: She is alert.  Skin: Skin is warm and dry. She is not diaphoretic.  Psychiatric: She has Beverly normal mood and affect. Her behavior is normal.  Assessment/Plan: Left wrist fx -- Splint for now but will need ORIF. NWB. Dr. Grandville Silos to come and evaluate later this morning. Anticipate outpatient surgery.    Lisette Abu, PA-C Orthopedic Surgery [phone] 02/25/2018, 9:00 AM    Previous patient of mine, presented to  emergency department following Beverly mechanical fall onto an outstretched hand and sustaining Beverly closed dorsally tilted distal radius fracture.  Sugar tong splint has been applied.  Fingers are warm and pink, with intact light touch sensibility in the radial, median, and ulnar nerve distribution, with intact motor to the same, including PIN and AIN.  Discussion was held with her regarding the degree of skeletal deformity and indications for operative treatment.  We will plan to proceed on an outpatient basis with appropriate timing.  Goals, risk, and options were reviewed and consent obtained.

## 2018-02-25 NOTE — ED Notes (Signed)
Ortho tech notified to apply splint / immobilizer on pt.

## 2018-02-25 NOTE — ED Triage Notes (Signed)
Pt reports slipping and falling about 2 hours ago, denies loc. states she used ice without relief. Obvious deformity noted to L forearm.

## 2018-02-25 NOTE — ED Notes (Signed)
Patient transported to X-ray 

## 2018-02-25 NOTE — ED Notes (Signed)
Ortho at bedside.

## 2018-02-27 DIAGNOSIS — S52552A Other extraarticular fracture of lower end of left radius, initial encounter for closed fracture: Secondary | ICD-10-CM | POA: Diagnosis not present

## 2018-02-27 DIAGNOSIS — Y999 Unspecified external cause status: Secondary | ICD-10-CM | POA: Diagnosis not present

## 2018-02-27 DIAGNOSIS — G8918 Other acute postprocedural pain: Secondary | ICD-10-CM | POA: Diagnosis not present

## 2018-02-27 DIAGNOSIS — S52592A Other fractures of lower end of left radius, initial encounter for closed fracture: Secondary | ICD-10-CM | POA: Diagnosis not present

## 2018-02-27 DIAGNOSIS — X58XXXA Exposure to other specified factors, initial encounter: Secondary | ICD-10-CM | POA: Diagnosis not present

## 2018-03-05 ENCOUNTER — Ambulatory Visit: Payer: Medicare Other

## 2018-03-10 DIAGNOSIS — S62102A Fracture of unspecified carpal bone, left wrist, initial encounter for closed fracture: Secondary | ICD-10-CM | POA: Diagnosis not present

## 2018-03-10 DIAGNOSIS — S52602D Unspecified fracture of lower end of left ulna, subsequent encounter for closed fracture with routine healing: Secondary | ICD-10-CM | POA: Diagnosis not present

## 2018-03-10 DIAGNOSIS — S52592D Other fractures of lower end of left radius, subsequent encounter for closed fracture with routine healing: Secondary | ICD-10-CM | POA: Diagnosis not present

## 2018-03-17 DIAGNOSIS — S52602D Unspecified fracture of lower end of left ulna, subsequent encounter for closed fracture with routine healing: Secondary | ICD-10-CM | POA: Diagnosis not present

## 2018-03-17 DIAGNOSIS — S52592D Other fractures of lower end of left radius, subsequent encounter for closed fracture with routine healing: Secondary | ICD-10-CM | POA: Diagnosis not present

## 2018-03-17 DIAGNOSIS — S52602G Unspecified fracture of lower end of left ulna, subsequent encounter for closed fracture with delayed healing: Secondary | ICD-10-CM | POA: Diagnosis not present

## 2018-03-20 DIAGNOSIS — S52602D Unspecified fracture of lower end of left ulna, subsequent encounter for closed fracture with routine healing: Secondary | ICD-10-CM | POA: Diagnosis not present

## 2018-03-20 DIAGNOSIS — S52592D Other fractures of lower end of left radius, subsequent encounter for closed fracture with routine healing: Secondary | ICD-10-CM | POA: Diagnosis not present

## 2018-03-20 DIAGNOSIS — S52602G Unspecified fracture of lower end of left ulna, subsequent encounter for closed fracture with delayed healing: Secondary | ICD-10-CM | POA: Diagnosis not present

## 2018-03-23 DIAGNOSIS — S52602D Unspecified fracture of lower end of left ulna, subsequent encounter for closed fracture with routine healing: Secondary | ICD-10-CM | POA: Diagnosis not present

## 2018-03-23 DIAGNOSIS — S52592D Other fractures of lower end of left radius, subsequent encounter for closed fracture with routine healing: Secondary | ICD-10-CM | POA: Diagnosis not present

## 2018-03-23 DIAGNOSIS — S52602G Unspecified fracture of lower end of left ulna, subsequent encounter for closed fracture with delayed healing: Secondary | ICD-10-CM | POA: Diagnosis not present

## 2018-03-26 DIAGNOSIS — S52592D Other fractures of lower end of left radius, subsequent encounter for closed fracture with routine healing: Secondary | ICD-10-CM | POA: Diagnosis not present

## 2018-03-26 DIAGNOSIS — S52602D Unspecified fracture of lower end of left ulna, subsequent encounter for closed fracture with routine healing: Secondary | ICD-10-CM | POA: Diagnosis not present

## 2018-03-30 DIAGNOSIS — E785 Hyperlipidemia, unspecified: Secondary | ICD-10-CM | POA: Diagnosis not present

## 2018-03-30 DIAGNOSIS — Z131 Encounter for screening for diabetes mellitus: Secondary | ICD-10-CM | POA: Diagnosis not present

## 2018-03-30 DIAGNOSIS — Z0000 Encounter for general adult medical examination without abnormal findings: Secondary | ICD-10-CM | POA: Diagnosis not present

## 2018-03-30 DIAGNOSIS — E559 Vitamin D deficiency, unspecified: Secondary | ICD-10-CM | POA: Diagnosis not present

## 2018-03-30 DIAGNOSIS — R42 Dizziness and giddiness: Secondary | ICD-10-CM | POA: Diagnosis not present

## 2018-03-31 DIAGNOSIS — S52592D Other fractures of lower end of left radius, subsequent encounter for closed fracture with routine healing: Secondary | ICD-10-CM | POA: Diagnosis not present

## 2018-03-31 DIAGNOSIS — S52602D Unspecified fracture of lower end of left ulna, subsequent encounter for closed fracture with routine healing: Secondary | ICD-10-CM | POA: Diagnosis not present

## 2018-04-02 DIAGNOSIS — S52602D Unspecified fracture of lower end of left ulna, subsequent encounter for closed fracture with routine healing: Secondary | ICD-10-CM | POA: Diagnosis not present

## 2018-04-02 DIAGNOSIS — S52592D Other fractures of lower end of left radius, subsequent encounter for closed fracture with routine healing: Secondary | ICD-10-CM | POA: Diagnosis not present

## 2018-04-06 ENCOUNTER — Other Ambulatory Visit: Payer: Self-pay

## 2018-04-06 ENCOUNTER — Ambulatory Visit
Admission: RE | Admit: 2018-04-06 | Discharge: 2018-04-06 | Disposition: A | Discharge status: Home / Self Care | Payer: Medicare Other | Source: Ambulatory Visit | Attending: Family Medicine | Admitting: Family Medicine

## 2018-04-06 DIAGNOSIS — Z1231 Encounter for screening mammogram for malignant neoplasm of breast: Secondary | ICD-10-CM | POA: Diagnosis not present

## 2018-04-06 DIAGNOSIS — S52592D Other fractures of lower end of left radius, subsequent encounter for closed fracture with routine healing: Secondary | ICD-10-CM | POA: Diagnosis not present

## 2018-04-06 DIAGNOSIS — S52602D Unspecified fracture of lower end of left ulna, subsequent encounter for closed fracture with routine healing: Secondary | ICD-10-CM | POA: Diagnosis not present

## 2018-04-09 DIAGNOSIS — S52602D Unspecified fracture of lower end of left ulna, subsequent encounter for closed fracture with routine healing: Secondary | ICD-10-CM | POA: Diagnosis not present

## 2018-04-09 DIAGNOSIS — S52592D Other fractures of lower end of left radius, subsequent encounter for closed fracture with routine healing: Secondary | ICD-10-CM | POA: Diagnosis not present

## 2018-04-09 DIAGNOSIS — S62102A Fracture of unspecified carpal bone, left wrist, initial encounter for closed fracture: Secondary | ICD-10-CM | POA: Diagnosis not present

## 2018-04-13 DIAGNOSIS — S52602D Unspecified fracture of lower end of left ulna, subsequent encounter for closed fracture with routine healing: Secondary | ICD-10-CM | POA: Diagnosis not present

## 2018-04-13 DIAGNOSIS — S52592D Other fractures of lower end of left radius, subsequent encounter for closed fracture with routine healing: Secondary | ICD-10-CM | POA: Diagnosis not present

## 2018-04-16 DIAGNOSIS — S52592D Other fractures of lower end of left radius, subsequent encounter for closed fracture with routine healing: Secondary | ICD-10-CM | POA: Diagnosis not present

## 2018-04-16 DIAGNOSIS — S52602D Unspecified fracture of lower end of left ulna, subsequent encounter for closed fracture with routine healing: Secondary | ICD-10-CM | POA: Diagnosis not present

## 2018-04-20 DIAGNOSIS — S52592D Other fractures of lower end of left radius, subsequent encounter for closed fracture with routine healing: Secondary | ICD-10-CM | POA: Diagnosis not present

## 2018-04-20 DIAGNOSIS — S52602D Unspecified fracture of lower end of left ulna, subsequent encounter for closed fracture with routine healing: Secondary | ICD-10-CM | POA: Diagnosis not present

## 2018-04-23 DIAGNOSIS — S52592D Other fractures of lower end of left radius, subsequent encounter for closed fracture with routine healing: Secondary | ICD-10-CM | POA: Diagnosis not present

## 2018-04-23 DIAGNOSIS — S52602D Unspecified fracture of lower end of left ulna, subsequent encounter for closed fracture with routine healing: Secondary | ICD-10-CM | POA: Diagnosis not present

## 2018-05-04 DIAGNOSIS — S52602D Unspecified fracture of lower end of left ulna, subsequent encounter for closed fracture with routine healing: Secondary | ICD-10-CM | POA: Diagnosis not present

## 2018-05-04 DIAGNOSIS — S52592D Other fractures of lower end of left radius, subsequent encounter for closed fracture with routine healing: Secondary | ICD-10-CM | POA: Diagnosis not present

## 2018-05-07 DIAGNOSIS — S52592D Other fractures of lower end of left radius, subsequent encounter for closed fracture with routine healing: Secondary | ICD-10-CM | POA: Diagnosis not present

## 2018-05-07 DIAGNOSIS — S52601A Unspecified fracture of lower end of right ulna, initial encounter for closed fracture: Secondary | ICD-10-CM | POA: Diagnosis not present

## 2018-05-07 DIAGNOSIS — S52551A Other extraarticular fracture of lower end of right radius, initial encounter for closed fracture: Secondary | ICD-10-CM | POA: Diagnosis not present

## 2018-05-07 DIAGNOSIS — S52602D Unspecified fracture of lower end of left ulna, subsequent encounter for closed fracture with routine healing: Secondary | ICD-10-CM | POA: Diagnosis not present

## 2018-05-07 DIAGNOSIS — S62102A Fracture of unspecified carpal bone, left wrist, initial encounter for closed fracture: Secondary | ICD-10-CM | POA: Diagnosis not present

## 2018-05-08 DIAGNOSIS — E785 Hyperlipidemia, unspecified: Secondary | ICD-10-CM | POA: Diagnosis not present

## 2018-05-08 DIAGNOSIS — R21 Rash and other nonspecific skin eruption: Secondary | ICD-10-CM | POA: Diagnosis not present

## 2018-05-09 DIAGNOSIS — R21 Rash and other nonspecific skin eruption: Secondary | ICD-10-CM | POA: Diagnosis not present

## 2018-05-14 DIAGNOSIS — L219 Seborrheic dermatitis, unspecified: Secondary | ICD-10-CM | POA: Diagnosis not present

## 2018-05-14 DIAGNOSIS — L719 Rosacea, unspecified: Secondary | ICD-10-CM | POA: Diagnosis not present

## 2018-05-14 DIAGNOSIS — Q809 Congenital ichthyosis, unspecified: Secondary | ICD-10-CM | POA: Diagnosis not present

## 2018-05-18 DIAGNOSIS — R6883 Chills (without fever): Secondary | ICD-10-CM | POA: Diagnosis not present

## 2018-05-18 DIAGNOSIS — Z20828 Contact with and (suspected) exposure to other viral communicable diseases: Secondary | ICD-10-CM | POA: Diagnosis not present

## 2018-05-18 DIAGNOSIS — R21 Rash and other nonspecific skin eruption: Secondary | ICD-10-CM | POA: Diagnosis not present

## 2018-05-25 DIAGNOSIS — S52592D Other fractures of lower end of left radius, subsequent encounter for closed fracture with routine healing: Secondary | ICD-10-CM | POA: Diagnosis not present

## 2018-05-25 DIAGNOSIS — S52602D Unspecified fracture of lower end of left ulna, subsequent encounter for closed fracture with routine healing: Secondary | ICD-10-CM | POA: Diagnosis not present

## 2018-05-28 DIAGNOSIS — S52592D Other fractures of lower end of left radius, subsequent encounter for closed fracture with routine healing: Secondary | ICD-10-CM | POA: Diagnosis not present

## 2018-05-28 DIAGNOSIS — S52602D Unspecified fracture of lower end of left ulna, subsequent encounter for closed fracture with routine healing: Secondary | ICD-10-CM | POA: Diagnosis not present

## 2018-06-01 DIAGNOSIS — S52602D Unspecified fracture of lower end of left ulna, subsequent encounter for closed fracture with routine healing: Secondary | ICD-10-CM | POA: Diagnosis not present

## 2018-06-01 DIAGNOSIS — S52592D Other fractures of lower end of left radius, subsequent encounter for closed fracture with routine healing: Secondary | ICD-10-CM | POA: Diagnosis not present

## 2018-06-02 DIAGNOSIS — L298 Other pruritus: Secondary | ICD-10-CM | POA: Diagnosis not present

## 2018-06-02 DIAGNOSIS — R21 Rash and other nonspecific skin eruption: Secondary | ICD-10-CM | POA: Diagnosis not present

## 2018-06-04 DIAGNOSIS — R200 Anesthesia of skin: Secondary | ICD-10-CM | POA: Diagnosis not present

## 2018-06-04 DIAGNOSIS — S52602D Unspecified fracture of lower end of left ulna, subsequent encounter for closed fracture with routine healing: Secondary | ICD-10-CM | POA: Diagnosis not present

## 2018-06-04 DIAGNOSIS — S52551A Other extraarticular fracture of lower end of right radius, initial encounter for closed fracture: Secondary | ICD-10-CM | POA: Diagnosis not present

## 2018-06-04 DIAGNOSIS — S52601A Unspecified fracture of lower end of right ulna, initial encounter for closed fracture: Secondary | ICD-10-CM | POA: Diagnosis not present

## 2018-06-04 DIAGNOSIS — S52592D Other fractures of lower end of left radius, subsequent encounter for closed fracture with routine healing: Secondary | ICD-10-CM | POA: Diagnosis not present

## 2018-06-23 DIAGNOSIS — G5622 Lesion of ulnar nerve, left upper limb: Secondary | ICD-10-CM | POA: Diagnosis not present

## 2018-06-30 DIAGNOSIS — G5622 Lesion of ulnar nerve, left upper limb: Secondary | ICD-10-CM | POA: Diagnosis not present

## 2018-07-16 DIAGNOSIS — G5622 Lesion of ulnar nerve, left upper limb: Secondary | ICD-10-CM | POA: Diagnosis not present

## 2018-08-17 DIAGNOSIS — M25532 Pain in left wrist: Secondary | ICD-10-CM | POA: Diagnosis not present

## 2018-08-17 DIAGNOSIS — G5622 Lesion of ulnar nerve, left upper limb: Secondary | ICD-10-CM | POA: Diagnosis not present

## 2018-08-21 ENCOUNTER — Telehealth: Payer: Self-pay | Admitting: Family Medicine

## 2018-09-04 DIAGNOSIS — G5622 Lesion of ulnar nerve, left upper limb: Secondary | ICD-10-CM | POA: Diagnosis not present

## 2018-09-06 DIAGNOSIS — Z20828 Contact with and (suspected) exposure to other viral communicable diseases: Secondary | ICD-10-CM | POA: Diagnosis not present

## 2018-09-06 DIAGNOSIS — Z01812 Encounter for preprocedural laboratory examination: Secondary | ICD-10-CM | POA: Diagnosis not present

## 2018-09-09 DIAGNOSIS — G5622 Lesion of ulnar nerve, left upper limb: Secondary | ICD-10-CM | POA: Diagnosis not present

## 2018-09-11 ENCOUNTER — Telehealth: Payer: Self-pay | Admitting: Family Medicine

## 2018-10-21 DIAGNOSIS — Z961 Presence of intraocular lens: Secondary | ICD-10-CM | POA: Diagnosis not present

## 2018-10-21 DIAGNOSIS — H353130 Nonexudative age-related macular degeneration, bilateral, stage unspecified: Secondary | ICD-10-CM | POA: Diagnosis not present

## 2018-10-21 DIAGNOSIS — H04123 Dry eye syndrome of bilateral lacrimal glands: Secondary | ICD-10-CM | POA: Diagnosis not present

## 2018-10-23 DIAGNOSIS — Z79899 Other long term (current) drug therapy: Secondary | ICD-10-CM | POA: Diagnosis not present

## 2018-10-23 DIAGNOSIS — L853 Xerosis cutis: Secondary | ICD-10-CM | POA: Diagnosis not present

## 2018-10-23 DIAGNOSIS — L821 Other seborrheic keratosis: Secondary | ICD-10-CM | POA: Diagnosis not present

## 2018-10-23 DIAGNOSIS — L570 Actinic keratosis: Secondary | ICD-10-CM | POA: Diagnosis not present

## 2018-10-23 DIAGNOSIS — Z8249 Family history of ischemic heart disease and other diseases of the circulatory system: Secondary | ICD-10-CM | POA: Diagnosis not present

## 2018-10-23 DIAGNOSIS — E7800 Pure hypercholesterolemia, unspecified: Secondary | ICD-10-CM | POA: Diagnosis not present

## 2018-10-23 DIAGNOSIS — Z886 Allergy status to analgesic agent status: Secondary | ICD-10-CM | POA: Diagnosis not present

## 2018-10-23 DIAGNOSIS — Z880 Allergy status to penicillin: Secondary | ICD-10-CM | POA: Diagnosis not present

## 2018-10-23 DIAGNOSIS — D1801 Hemangioma of skin and subcutaneous tissue: Secondary | ICD-10-CM | POA: Diagnosis not present

## 2018-11-05 DIAGNOSIS — M25522 Pain in left elbow: Secondary | ICD-10-CM | POA: Diagnosis not present

## 2018-11-12 DIAGNOSIS — M25522 Pain in left elbow: Secondary | ICD-10-CM | POA: Diagnosis not present

## 2018-11-30 DIAGNOSIS — Z961 Presence of intraocular lens: Secondary | ICD-10-CM | POA: Diagnosis not present

## 2018-11-30 DIAGNOSIS — H353132 Nonexudative age-related macular degeneration, bilateral, intermediate dry stage: Secondary | ICD-10-CM | POA: Diagnosis not present

## 2020-02-15 IMAGING — MG DIGITAL SCREENING BILATERAL MAMMOGRAM WITH TOMO AND CAD
6 of 10 series · 6 of 30 positions shown · non-contrast
Comparison: Previous exam(s).

CLINICAL DATA: Screening.

EXAM:
DIGITAL SCREENING BILATERAL MAMMOGRAM WITH TOMO AND CAD

[R CC synth-2D (1 of 2)]
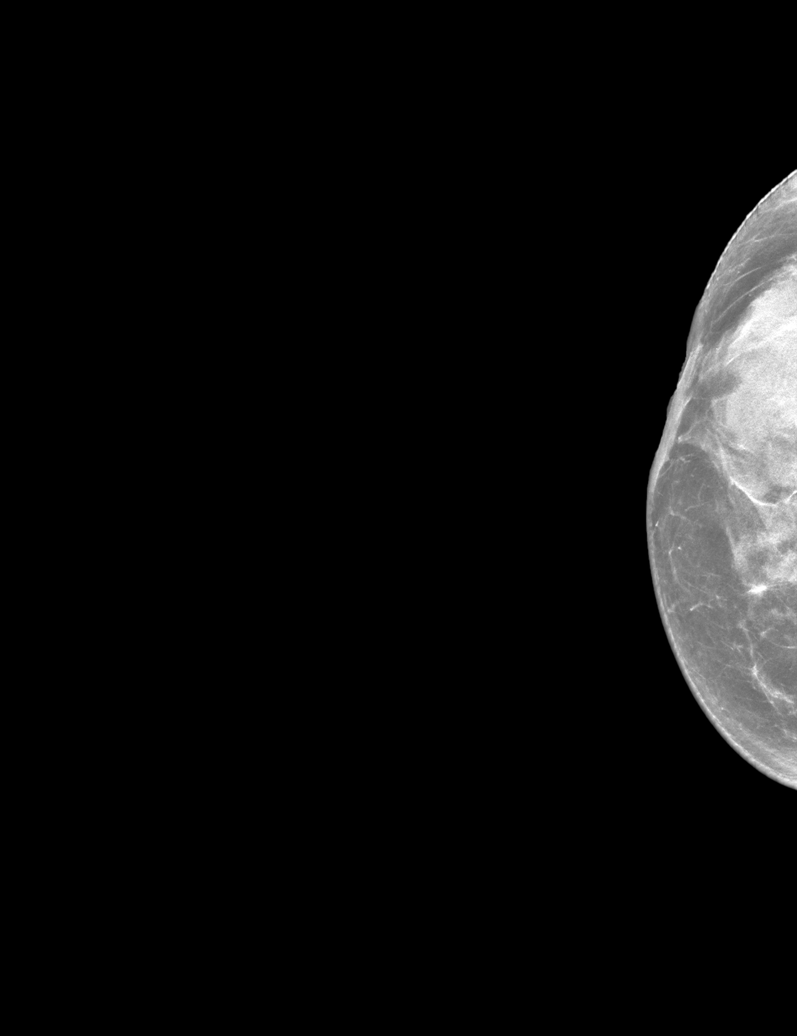

[L MLO synth-2D]
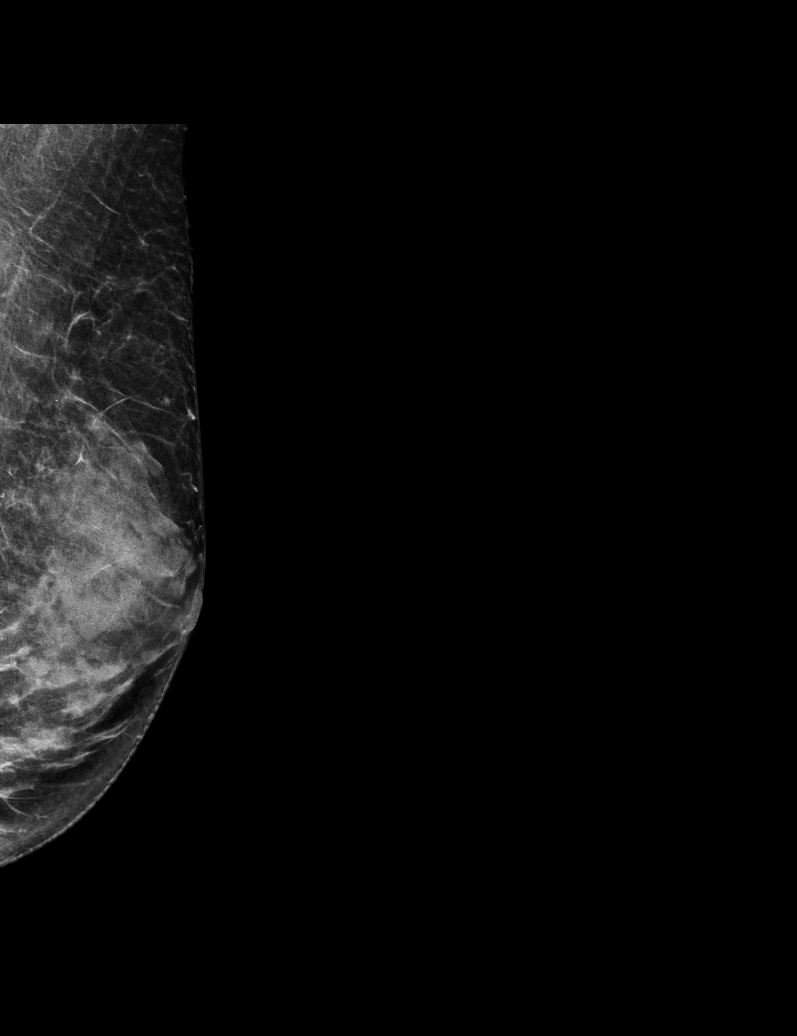

[R MLO synth-2D]
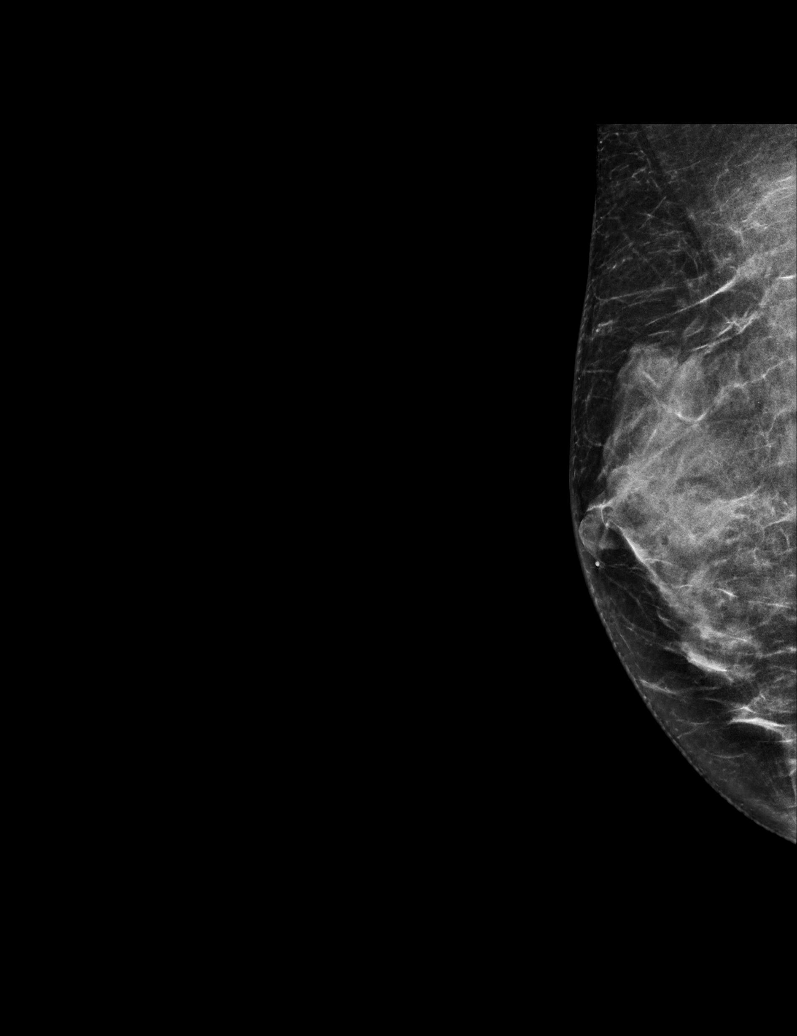

[R CC synth-2D (2 of 2)]
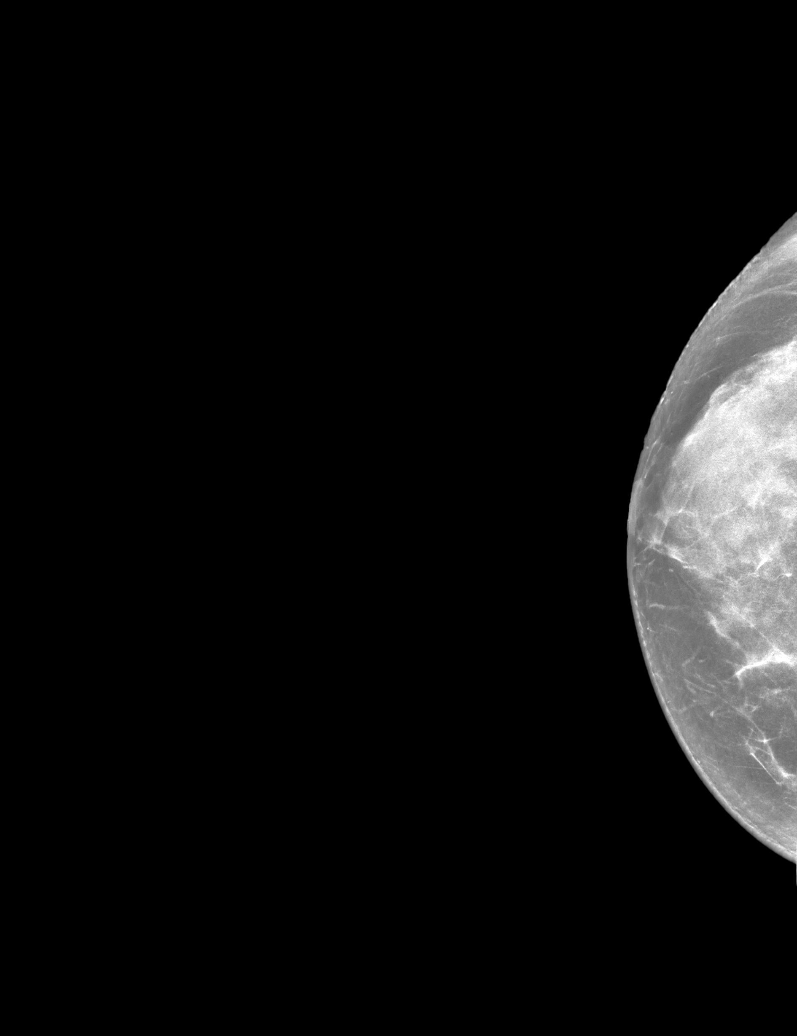

[L CC synth-2D]
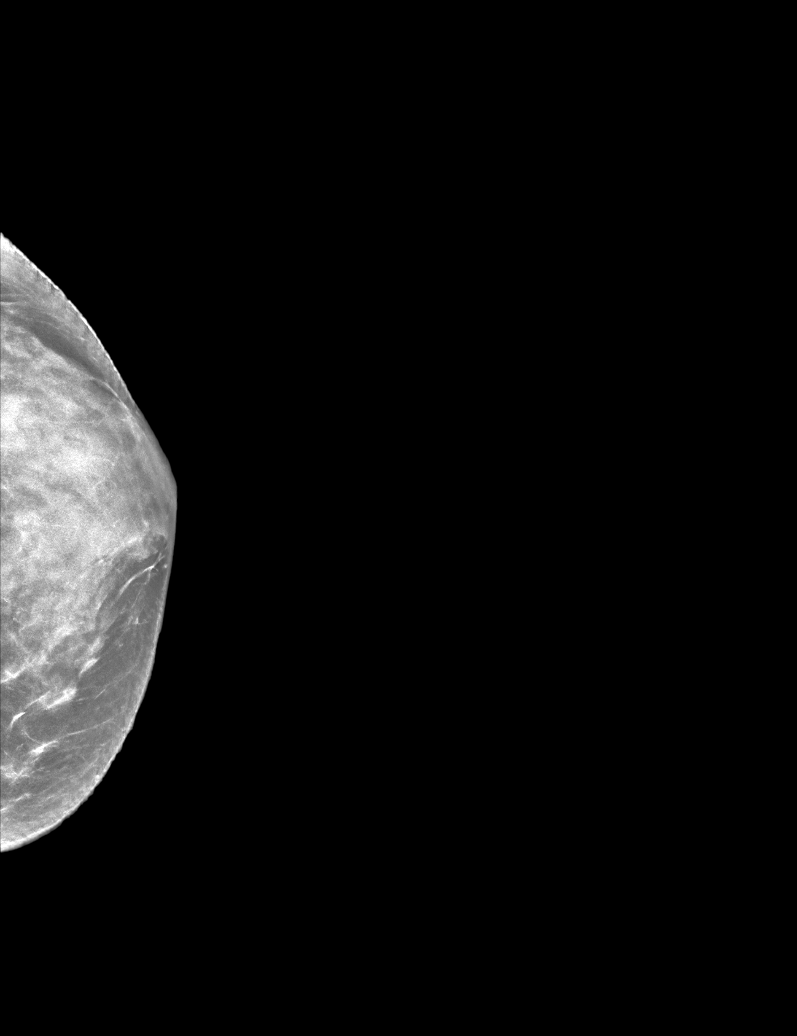

[L MLO tomo · tomo slice 26/51.0]
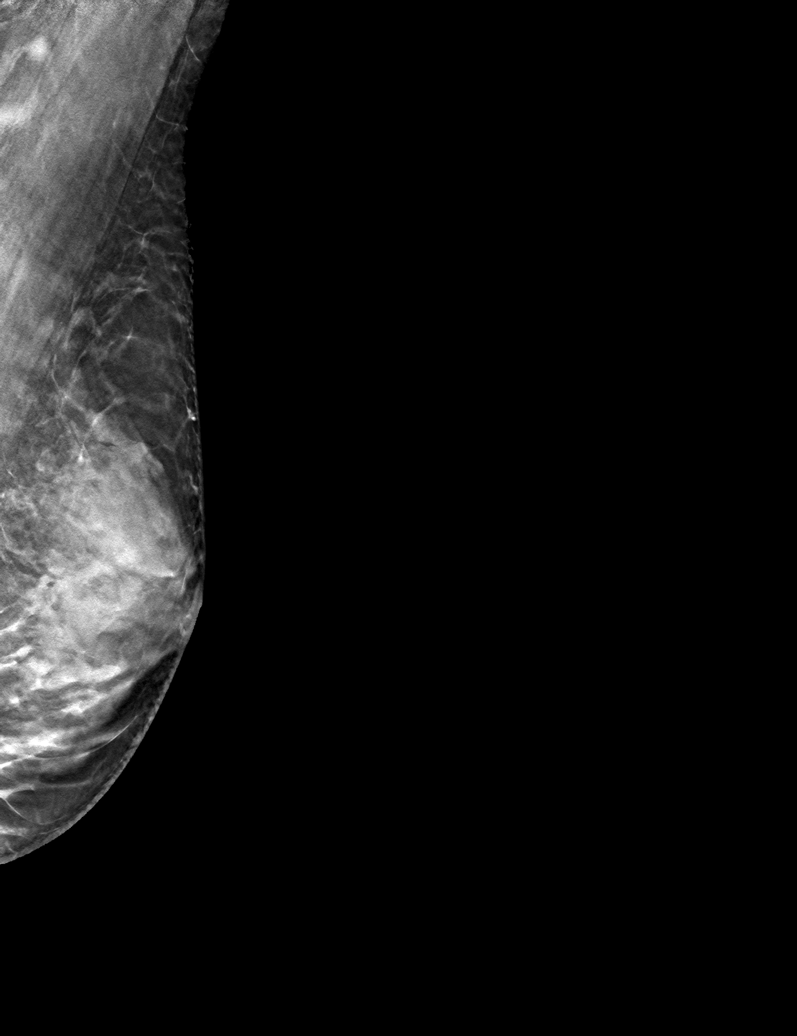

[6 of 30 positions shown; findings below may reference images not displayed]

ACR Breast Density Category d: The breast tissue is extremely dense,
which lowers the sensitivity of mammography
FINDINGS: There are no findings suspicious for malignancy. Images were
processed with CAD.
IMPRESSION: No mammographic evidence of malignancy. A result letter of this
screening mammogram will be mailed directly to the patient.

RECOMMENDATION:
Screening mammogram in one year. (Code:WO-[id number])

BI-RADS CATEGORY  1: Negative.
